# Patient Record
Sex: Female | Born: 1975 | Race: White | Hispanic: No | Marital: Married | Smoking: Never smoker
Health system: Southern US, Community
[De-identification: ages and names within clinical notes are randomized; demographics above are authoritative.]

---

## 2021-11-21 ENCOUNTER — Emergency Department (HOSPITAL_COMMUNITY): Payer: 59

## 2021-11-21 ENCOUNTER — Other Ambulatory Visit (HOSPITAL_COMMUNITY): Payer: 59

## 2021-11-21 ENCOUNTER — Other Ambulatory Visit: Payer: Self-pay

## 2021-11-21 ENCOUNTER — Encounter (HOSPITAL_COMMUNITY): Payer: Self-pay | Admitting: Cardiology

## 2021-11-21 ENCOUNTER — Observation Stay (HOSPITAL_COMMUNITY)
Admission: EM | Admit: 2021-11-21 | Discharge: 2021-11-22 | Disposition: A | Discharge status: Home / Self Care | Payer: 59 | Attending: Cardiology | Admitting: Cardiology

## 2021-11-21 DIAGNOSIS — R55 Syncope and collapse: Principal | ICD-10-CM | POA: Insufficient documentation

## 2021-11-21 DIAGNOSIS — R000 Tachycardia, unspecified: Secondary | ICD-10-CM | POA: Diagnosis not present

## 2021-11-21 DIAGNOSIS — R9431 Abnormal electrocardiogram [ECG] [EKG]: Secondary | ICD-10-CM | POA: Insufficient documentation

## 2021-11-21 DIAGNOSIS — R002 Palpitations: Principal | ICD-10-CM | POA: Insufficient documentation

## 2021-11-21 DIAGNOSIS — R7989 Other specified abnormal findings of blood chemistry: Secondary | ICD-10-CM | POA: Diagnosis not present

## 2021-11-21 DIAGNOSIS — Z20822 Contact with and (suspected) exposure to covid-19: Secondary | ICD-10-CM | POA: Diagnosis not present

## 2021-11-21 LAB — COMPREHENSIVE METABOLIC PANEL
ALT: 19 U/L (ref 0–44)
AST: 22 U/L (ref 15–41)
Albumin: 3.9 g/dL (ref 3.5–5.0)
Alkaline Phosphatase: 46 U/L (ref 38–126)
Anion gap: 9 (ref 5–15)
BUN: 9 mg/dL (ref 6–20)
CO2: 23 mmol/L (ref 22–32)
Calcium: 8.8 mg/dL — ABNORMAL LOW (ref 8.9–10.3)
Chloride: 107 mmol/L (ref 98–111)
Creatinine, Ser: 0.78 mg/dL (ref 0.44–1.00)
GFR, Estimated: >60 mL/min (ref >60)
Glucose, Bld: 97 mg/dL (ref 70–99)
Potassium: 3.9 mmol/L (ref 3.5–5.1)
Sodium: 139 mmol/L (ref 135–145)
Total Bilirubin: 0.4 mg/dL (ref 0.3–1.2)
Total Protein: 6.8 g/dL (ref 6.5–8.1)

## 2021-11-21 LAB — TSH: TSH: 1.209 u[IU]/mL (ref 0.350–4.500)

## 2021-11-21 LAB — CBC WITH DIFFERENTIAL/PLATELET
Abs Immature Granulocytes: 0.02 10*3/uL (ref 0.00–0.07)
Basophils Absolute: 0.1 10*3/uL (ref 0.0–0.1)
Basophils Relative: 1 %
Eosinophils Absolute: 0 10*3/uL (ref 0.0–0.5)
Eosinophils Relative: 1 %
HCT: 46.8 % — ABNORMAL HIGH (ref 36.0–46.0)
Hemoglobin: 16.1 g/dL — ABNORMAL HIGH (ref 12.0–15.0)
Immature Granulocytes: 0 %
Lymphocytes Relative: 15 %
Lymphs Abs: 0.9 10*3/uL (ref 0.7–4.0)
MCH: 33.1 pg (ref 26.0–34.0)
MCHC: 34.4 g/dL (ref 30.0–36.0)
MCV: 96.1 fL (ref 80.0–100.0)
Monocytes Absolute: 0.5 10*3/uL (ref 0.1–1.0)
Monocytes Relative: 7 %
Neutro Abs: 5 10*3/uL (ref 1.7–7.7)
Neutrophils Relative %: 76 %
Platelets: 240 10*3/uL (ref 150–400)
RBC: 4.87 MIL/uL (ref 3.87–5.11)
RDW: 12.3 % (ref 11.5–15.5)
WBC: 6.5 10*3/uL (ref 4.0–10.5)
nRBC: 0 % (ref 0.0–0.2)

## 2021-11-21 LAB — MAGNESIUM: Magnesium: 2 mg/dL (ref 1.7–2.4)

## 2021-11-21 LAB — TROPONIN I (HIGH SENSITIVITY)
Troponin I (High Sensitivity): 5 ng/L (ref <18)
Troponin I (High Sensitivity): 6 ng/L (ref <18)

## 2021-11-21 LAB — I-STAT BETA HCG BLOOD, ED (MC, WL, AP ONLY): I-stat hCG, quantitative: <5 m[IU]/mL (ref <5)

## 2021-11-21 LAB — BRAIN NATRIURETIC PEPTIDE: B Natriuretic Peptide: 318 pg/mL — ABNORMAL HIGH (ref 0.0–100.0)

## 2021-11-21 LAB — RESP PANEL BY RT-PCR (FLU A&B, COVID) ARPGX2
Influenza A by PCR: NEGATIVE
Influenza B by PCR: NEGATIVE
SARS Coronavirus 2 by RT PCR: NEGATIVE

## 2021-11-21 LAB — D-DIMER, QUANTITATIVE: D-Dimer, Quant: 0.31 ug/mL-FEU (ref 0.00–0.50)

## 2021-11-21 MED ORDER — LACTATED RINGERS IV BOLUS
1000.0000 mL | Freq: Once | INTRAVENOUS | Status: AC
Start: 2021-11-21 — End: 2021-11-21
  Administered 2021-11-21: 1000 mL via INTRAVENOUS

## 2021-11-21 NOTE — ED Provider Notes (Addendum)
[facility] EMERGENCY DEPARTMENT Provider Note   CSN: [id number] Arrival date & time: 11/21/21  1104     History  Chief Complaint  Patient presents with   Palpitations   Shortness of Breath    Amanda Oneal is a 46 y.o. female.   Palpitations Associated symptoms: chest pain and shortness of breath   Associated symptoms: no cough   Shortness of Breath Associated symptoms: chest pain   Associated symptoms: no cough    46 year old female presenting to the emergency department with chief complaint of palpitations.  The patient states that she has had intermittent palpitations "for years".  She was seen by her PCP for this earlier this week and there was some concern on EKG for possible WPW.  She was referred to a cardiologist with a plan for Holter monitor placement and had follow-up scheduled for next week.  This morning, she was leaning over at her dresser and immediately felt lightheaded, short of breath with a complaint of chest pressure and squeezing and felt heart palpitations.  Symptoms resolved and she went to church but they then returned at church.  She currently denies any chest pain or shortness of breath and her symptoms of palpitations have since resolved.  She denies any fevers or chills, cough.  She denies any recent travel.  Home Medications Prior to Admission medications   Not on File      Allergies    Patient has no allergy information on record.    Review of Systems   Review of Systems  Respiratory:  Positive for shortness of breath. Negative for cough.   Cardiovascular:  Positive for chest pain and palpitations. Negative for leg swelling.  All other systems reviewed and are negative.  Physical Exam Updated Vital Signs BP 110/62 (BP Location: Right Arm)    Pulse 65    Temp 98 F (36.7 C) (Oral)    Resp 15    Ht 5\' 5"  (1.651 m)    Wt 77.1 kg    SpO2 99%    BMI 28.29 kg/m  Physical Exam Vitals and nursing note reviewed.  Constitutional:       General: She is not in acute distress.    Appearance: She is well-developed.  HENT:     Head: Normocephalic and atraumatic.  Eyes:     Conjunctiva/sclera: Conjunctivae normal.     Pupils: Pupils are equal, round, and reactive to light.  Cardiovascular:     Rate and Rhythm: Normal rate and regular rhythm.     Heart sounds: No murmur heard. Pulmonary:     Effort: Pulmonary effort is normal. No respiratory distress.     Breath sounds: Normal breath sounds.  Abdominal:     General: There is no distension.     Palpations: Abdomen is soft.     Tenderness: There is no abdominal tenderness. There is no guarding.  Musculoskeletal:        General: No swelling, deformity or signs of injury. Normal range of motion.     Cervical back: Neck supple.     Right lower leg: No tenderness. No edema.     Left lower leg: No tenderness. No edema.  Skin:    General: Skin is warm and dry.     Capillary Refill: Capillary refill takes less than 2 seconds.     Findings: No lesion or rash.  Neurological:     General: No focal deficit present.     Mental Status: She is alert. Mental  status is at baseline.  Psychiatric:        Mood and Affect: Mood normal.    ED Results / Procedures / Treatments   Labs (all labs ordered are listed, but only abnormal results are displayed) Labs Reviewed  CBC WITH DIFFERENTIAL/PLATELET - Abnormal; Notable for the following components:      Result Value   Hemoglobin 16.1 (*)    HCT 46.8 (*)    All other components within normal limits  COMPREHENSIVE METABOLIC PANEL - Abnormal; Notable for the following components:   Calcium 8.8 (*)    All other components within normal limits  BRAIN NATRIURETIC PEPTIDE - Abnormal; Notable for the following components:   B Natriuretic Peptide 318.0 (*)    All other components within normal limits  RESP PANEL BY RT-PCR (FLU A&B, COVID) ARPGX2  TSH  D-DIMER, QUANTITATIVE  MAGNESIUM  I-STAT BETA HCG BLOOD, ED ([facility], [facility], [facility] ONLY)   TROPONIN I (HIGH SENSITIVITY)    EKG None  Radiology DG Chest 2 View  Result Date: 11/21/2021 CLINICAL DATA:  Near syncope.  Shortness of breath. EXAM: CHEST - 2 VIEW COMPARISON:  None. FINDINGS: The heart size and mediastinal contours are within normal limits. Both lungs are clear. The visualized skeletal structures are unremarkable. IMPRESSION: No active cardiopulmonary disease. Electronically Signed   By: Kerby Moors M.D.   On: 11/21/2021 12:19    Procedures Procedures    Medications Ordered in ED Medications  lactated ringers bolus 1,000 mL (has no administration in time range)    ED Course/ Medical Decision Making/ A&P                           Medical Decision Making Amount and/or Complexity of Data Reviewed Labs: ordered. Radiology: ordered.  Risk Decision regarding hospitalization.   46 year old female presenting to the emergency department with chief complaint of palpitations.  The patient states that she has had intermittent palpitations "for years".  She was seen by her PCP for this earlier this week and there was some concern on EKG for possible WPW.  She was referred to a cardiologist with a plan for Holter monitor placement and had follow-up scheduled for next week.  This morning, she was leaning over at her dresser and immediately felt lightheaded, short of breath with a complaint of chest pressure and squeezing and felt heart palpitations.  Symptoms resolved and she went to church but they then returned at church.  She currently denies any chest pain or shortness of breath and her symptoms of palpitations have since resolved.  She denies any fevers or chills, cough.  She denies any recent travel.  Additional history provided by the patient's significant other who was present bedside.  Reviewed the patient's PCP notes in epic.  On arrival, the patient was afebrile, hemodynamically stable, saturating well on room air, normal sinus rhythm noted on cardiac telemetry.   Patient presenting with palpitations and near syncopal episode that occurred x2 today.  Also associated episode of chest pressure.  No signs or symptoms of DVT on exam.  Frontal diagnosis includes cardiac arrhythmia to include WPW, other accessory pathway, SVT, orthostatic hypotension, vasovagal near syncope, thyroid dysfunction, PE.   An EKG was performed which revealed sinus rhythm, ventricular rate 71, PR 119, QRS borderline widened at 119 with a slurred delta wave upstroke concerning for possible WPW.    Screening laboratory work-up significant for hCG normal, D-dimer negative at 0.31, CMP without electrolyte  abnormality, CBC with signs of hemoconcentration with a hemoglobin of 16.1, new look no leukocytosis, magnesium normal, TSH normal, initial troponin negative, repeat pending, BNP nonspecifically moderately elevated to 318.  I spoke with on-call cardiology Dr. Shanon Brow who agrees with that there is concern for accessory pathway and septic the patient in admission.  Talk with the patient and her significant other at bedside regarding the plan for admission and updated them regarding the plan of care.  Final Clinical Impression(s) / ED Diagnoses Final diagnoses:  Heart palpitations  Elevated brain natriuretic peptide (BNP) level    Rx / DC Orders ED Discharge Orders     None         Regan Lemming, MD 11/21/21 1346    Regan Lemming, MD 11/21/21 1348

## 2021-11-21 NOTE — Consult Note (Deleted)
CARDIOLOGY CONSULT NOTE  Patient ID: Story Conti MRN: [id number] DOB/AGE: 02/21/1976 46 y.o.  Admit date: 11/21/2021 Referring Physician: [facility] Primary Physician:  Kathlen Brunswick, NP Reason for Consultation:  Near syncope  HPI:   46 y.o. Caucasian female  with no significant personal medical history, family history of early CAD in father, presented with palpitations and near syncope.  Patient is here with her husband.  She works from home.  Today, she was getting ready for church, when she had sudden onset palpitation associated with lightheadedness and near syncope.  Patient sat down.  Symptoms wore off in a few minutes.  She was able to get ready and go to the church.  She had recurrence of similar symptoms while at the church.  She managed to get outside the church, and felt a sudden rush of similar symptoms.  Vagal maneuvers did not help on both occasions. She did not lose consciousness.  She called 911 and was brought to emergency room.  By the time patient arrived at ER, symptoms had resolved.  She has not had any symptoms of tachycardia while in the ER.  Resting EKG showed possible preexcitation accessory pathway. BNP elevated at 380. She denies any dyspnea, orthopnea, PND, leg edema symptoms.   Patient has had similar symptoms to lesser extent over the past couple years. She has had one syncope over 6 months ago that was felt to be associated with IBS and bowel motions, possible vasovagal syncope.   History reviewed. No pertinent past medical history.   History reviewed. No pertinent surgical history.    Family History  Problem Relation Age of Onset   CAD Father      Social History: Social History   Socioeconomic History   Marital status: Married    Spouse name: Not on file   Number of children: Not on file   Years of education: Not on file   Highest education level: Not on file  Occupational History   Not on file  Tobacco Use   Smoking status: Not on file    Smokeless tobacco: Not on file  Substance and Sexual Activity   Alcohol use: Not on file   Drug use: Not on file   Sexual activity: Not on file  Other Topics Concern   Not on file  Social History Narrative   Not on file   Social Determinants of Health   Financial Resource Strain: Not on file  Food Insecurity: Not on file  Transportation Needs: Not on file  Physical Activity: Not on file  Stress: Not on file  Social Connections: Not on file  Intimate Partner Violence: Not on file     (Not in a hospital admission)   Review of Systems  Cardiovascular:  Positive for palpitations. Negative for chest pain, dyspnea on exertion, leg swelling and syncope.  Neurological:  Positive for light-headedness.     Physical Exam: Physical Exam Vitals and nursing note reviewed.  Constitutional:      General: She is not in acute distress. Neck:     Vascular: No JVD.  Cardiovascular:     Rate and Rhythm: Normal rate and regular rhythm.     Heart sounds: Normal heart sounds. No murmur heard. Pulmonary:     Effort: Pulmonary effort is normal.     Breath sounds: Normal breath sounds. No wheezing or rales.  Musculoskeletal:     Right lower leg: No edema.     Left lower leg: No edema.  Lab Results: Reviewed and interpreted: CBC, BMP  Imaging/tests reviewed and independently interpreted: Chest Xray  Tests ordered: Echocardiogram  Cardiac Studies:  Telemetry 11/21/2021: No tachycardia  EKG 11/21/2020: Sinus rhythm 71 bpm Short PR interval with delta wave suggestive of pre-excitation accessory pathway  Echocardiogram : Ordered  Assessment & Recommendations:   46 y.o. Caucasian female  with no significant personal medical history, family history of early CAD in father, presented with palpitations and near syncope.  Near syncope: Symptoms concerning for probable tachyarrhythmia. 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  Recommend overnight  telemetry. Should she have AVRT, IV procainamide will be recommended. Will then seek EP input for ablation. If not, will discharge on 2 week cardiac telemetry. Echocardiogram ordered.  Elevated BNP: Likely related to tachyarrhythmia. Unlikely to represent heart failure.      Elder Negus, MD Pager: [phone] Office: [phone]

## 2021-11-21 NOTE — TOC Progression Note (Signed)
Transition of Care [facility]) - Progression Note    Patient Details  Name: Amanda Oneal MRN: [id number] Date of Birth: 07-04-1976  Transition of Care [facility]) CM/SW Contact  Leone Haven, RN Phone Number: 11/21/2021, 3:52 PM  Clinical Narrative:     Transition of Care [facility]) Screening Note   Patient Details  Name: Amanda Oneal Date of Birth: [birth year]   Transition of Care [facility]) CM/SW Contact:    Leone Haven, RN Phone Number: 11/21/2021, 3:52 PM    Transition of Care Department [facility]) has reviewed patient and no TOC needs have been identified at this time. We will continue to monitor patient advancement through interdisciplinary progression rounds. If new patient transition needs arise, please place a TOC consult.          Expected Discharge Plan and Services                                                 Social Determinants of Health (SDOH) Interventions    Readmission Risk Interventions No flowsheet data found.

## 2021-11-21 NOTE — ED Triage Notes (Signed)
Pt arrived via [facility] from church. Per EMS, pt c/o palpitations, SOB, and feeling faint since she woke up this morning and that it worsened after getting to church. Pt was evaluated last week by PCP with same complaints and was possibly in WPW at the time and was referred to a cardiologist. Pt states significant increase in stress the past month. EMS further reports pt c/o tingling in extremities with tachypnea on their initial contact which improved throughout transport. Pt arrived to ED caox4, in no obvious distress, denying pain or palpitations.

## 2021-11-22 ENCOUNTER — Inpatient Hospital Stay (HOSPITAL_COMMUNITY): Payer: 59

## 2021-11-22 ENCOUNTER — Other Ambulatory Visit: Payer: Self-pay | Admitting: Cardiology

## 2021-11-22 DIAGNOSIS — Z8249 Family history of ischemic heart disease and other diseases of the circulatory system: Secondary | ICD-10-CM

## 2021-11-22 DIAGNOSIS — R002 Palpitations: Secondary | ICD-10-CM

## 2021-11-22 DIAGNOSIS — R072 Precordial pain: Secondary | ICD-10-CM

## 2021-11-22 DIAGNOSIS — R55 Syncope and collapse: Secondary | ICD-10-CM

## 2021-11-22 LAB — ECHOCARDIOGRAM COMPLETE
AR max vel: 2.81 cm2
AV Peak grad: 6.2 mmHg
Ao pk vel: 1.24 m/s
Area-P 1/2: 3.12 cm2
Calc EF: 58.5 %
Height: 65 in
S' Lateral: 2.9 cm
Single Plane A2C EF: 59 %
Single Plane A4C EF: 57.5 %
Weight: 2774.4 oz

## 2021-11-22 MED ORDER — ENOXAPARIN SODIUM 40 MG/0.4ML IJ SOSY
40.0000 mg | PREFILLED_SYRINGE | INTRAMUSCULAR | Status: DC
  Administered 2021-11-22: 40 mg via SUBCUTANEOUS
  Filled 2021-11-22: qty 0.4

## 2021-11-22 NOTE — TOC Transition Note (Signed)
Transition of Care [facility]) - CM/SW Discharge Note   Patient Details  Name: Amanda Oneal MRN: [id number] Date of Birth: Jun 23, 1976  Transition of Care [facility]) CM/SW Contact:  Leone Haven, RN Phone Number: 11/22/2021, 12:17 PM   Clinical Narrative:    Patient is for dc today, she states her Mother will transport her home today.  She is indep and has no needs.    Final next level of care: Home/Self Care Barriers to Discharge: No Barriers Identified   Patient Goals and CMS Choice Patient states their goals for this hospitalization and ongoing recovery are:: return home   Choice offered to / list presented to : NA  Discharge Placement                       Discharge Plan and Services                  DME Agency: NA                  Social Determinants of Health (SDOH) Interventions     Readmission Risk Interventions No flowsheet data found.

## 2021-11-22 NOTE — Discharge Summary (Signed)
Physician Discharge Summary  Patient ID: Amanda Oneal MRN: [id number] DOB/AGE: 06-26-1976 46 y.o.  Admit date: 11/21/2021 Discharge date: 11/22/2021  Primary Discharge Diagnosis: Near syncope Sinus tachycardia Pre-excitation    HPI (11/21/2021):   46 y.o. Caucasian female  with no significant personal medical history, family history of early CAD in father, presented with palpitations and near syncope.   Patient is here with her husband.  She works from home.  Today, she was getting ready for church, when she had sudden onset palpitation associated with lightheadedness and near syncope.  Patient sat down.  Symptoms wore off in a few minutes.  She was able to get ready and go to the church.  She had recurrence of similar symptoms while at the church.  She managed to get outside the church, and felt a sudden rush of similar symptoms.  Vagal maneuvers did not help on both occasions. She did not lose consciousness.  She called 911 and was brought to emergency room.  By the time patient arrived at ER, symptoms had resolved.  She has not had any symptoms of tachycardia while in the ER.  Resting EKG showed possible preexcitation accessory pathway. BNP elevated at 380. She denies any dyspnea, orthopnea, PND, leg edema symptoms.    Patient has had similar symptoms to lesser extent over the past couple years. She has had one syncope over 6 months ago that was felt to be associated with IBS and bowel motions, possible vasovagal syncope.    Hospital Course: Hospital course was uneventful. She had a few episodes of tachycardia, all sinus tachycardia. No AVRT seen. Echocardiogram showed structurally normal heart. Patient was discharged with plans for outpatient 2 week live cardiac telemetry, exercise treadmill stress test, CT cardiac scoring.   Discharge Exam: Blood pressure 117/65, pulse 77, temperature 98.6 F (37 C), temperature source Oral, resp. rate 18, height 5\' 5"  (1.651 m), weight 78.7 kg, SpO2 97 %.    Physical Exam Vitals and nursing note reviewed.  Constitutional:      General: She is not in acute distress. Neck:     Vascular: No JVD.  Cardiovascular:     Rate and Rhythm: Normal rate and regular rhythm.     Heart sounds: Normal heart sounds. No murmur heard. Pulmonary:     Effort: Pulmonary effort is normal.     Breath sounds: Normal breath sounds. No wheezing or rales.  Musculoskeletal:     Right lower leg: No edema.     Left lower leg: No edema.      Significant Diagnostic Studies:  EKG 11/21/2020: Sinus rhythm 71 bpm Short PR interval with delta wave suggestive of pre-excitation accessory pathway   Echocardiogram 11/22/2021:  1. Left ventricular ejection fraction, by estimation, is 60 to 65%. The  left ventricle has normal function. The left ventricle has no regional  wall motion abnormalities. Left ventricular diastolic parameters were  normal.   2. Right ventricular systolic function is normal. The right ventricular  size is normal.   3. The mitral valve is normal in structure. No evidence of mitral valve  regurgitation. No evidence of mitral stenosis.   4. The aortic valve is normal in structure. Aortic valve regurgitation is  not visualized. No aortic stenosis is present.   Labs:   Lab Results  Component Value Date   WBC 6.5 11/21/2021   HGB 16.1 (H) 11/21/2021   HCT 46.8 (H) 11/21/2021   MCV 96.1 11/21/2021   PLT 240 11/21/2021    Recent Labs  Lab 11/21/21 1143  NA 139  K 3.9  CL 107  CO2 23  BUN 9  CREATININE 0.78  CALCIUM 8.8*  PROT 6.8  BILITOT 0.4  ALKPHOS 46  ALT 19  AST 22  GLUCOSE 97    Lipid Panel  No results found for: CHOL, TRIG, HDL, CHOLHDL, VLDL, LDLCALC  BNP (last 3 results) Recent Labs    11/21/21 1143  BNP 318.0*   TSH Recent Labs    11/21/21 1143  TSH 1.209    Radiology: DG Chest 2 View  Result Date: 11/21/2021 CLINICAL DATA:  Near syncope.  Shortness of breath. EXAM: CHEST - 2 VIEW COMPARISON:  None.  FINDINGS: The heart size and mediastinal contours are within normal limits. Both lungs are clear. The visualized skeletal structures are unremarkable. IMPRESSION: No active cardiopulmonary disease. Electronically Signed   By: Signa Kellaylor  Stroud M.D.   On: 11/21/2021 12:19   ECHOCARDIOGRAM COMPLETE  Result Date: 11/22/2021    ECHOCARDIOGRAM REPORT   [name] Date of Exam: 11/22/2021 Medical Rec #:  [id number]     Height:       65.0 in Accession #:    [id number]    Weight:       173.4 lb Date of Birth:  October 07, 1976     BSA:          1.862 m Patient Age:    46 years      BP:           112/75 mmHg Patient Gender: F             HR:           70 bpm. Exam Location:  Inpatient Procedure: 2D Echo, Cardiac Doppler and Color Doppler Indications:    Abnormal EKG  History:        Patient has no prior history of Echocardiogram examinations.  Sonographer:    Cleatis Polkaaylor Peper Referring Phys: [id number] [facility] Rina Adney IMPRESSIONS  1. Left ventricular ejection fraction, by estimation, is 60 to 65%. The left ventricle has normal function. The left ventricle has no regional wall motion abnormalities. Left ventricular diastolic parameters were normal.  2. Right ventricular systolic function is normal. The right ventricular size is normal.  3. The mitral valve is normal in structure. No evidence of mitral valve regurgitation. No evidence of mitral stenosis.  4. The aortic valve is normal in structure. Aortic valve regurgitation is not visualized. No aortic stenosis is present. FINDINGS  Left Ventricle: Left ventricular ejection fraction, by estimation, is 60 to 65%. The left ventricle has normal function. The left ventricle has no regional wall motion abnormalities. The left ventricular internal cavity size was normal in size. There is  no left ventricular hypertrophy. Left ventricular diastolic parameters were normal. Right Ventricle: The right ventricular size is normal. No increase in right ventricular wall thickness.  Right ventricular systolic function is normal. Left Atrium: Left atrial size was normal in size. Right Atrium: Right atrial size was normal in size. Pericardium: There is no evidence of pericardial effusion. Mitral Valve: The mitral valve is normal in structure. No evidence of mitral valve regurgitation. No evidence of mitral valve stenosis. Tricuspid Valve: The tricuspid valve is normal in structure. Tricuspid valve regurgitation is not demonstrated. No evidence of tricuspid stenosis. Aortic Valve: The aortic valve is normal in structure. Aortic valve regurgitation is not visualized. No aortic stenosis is present. Aortic valve peak gradient measures 6.2 mmHg. Pulmonic Valve: The pulmonic valve  was normal in structure. Pulmonic valve regurgitation is not visualized. No evidence of pulmonic stenosis. Aorta: The aortic root is normal in size and structure. IAS/Shunts: The interatrial septum was not assessed.  LEFT VENTRICLE PLAX 2D LVIDd:         3.80 cm     Diastology LVIDs:         2.90 cm     LV e' medial:    11.30 cm/s LV PW:         0.70 cm     LV E/e' medial:  4.4 LV IVS:        0.90 cm     LV e' lateral:   14.50 cm/s LVOT diam:     2.00 cm     LV E/e' lateral: 3.5 LV SV:         59 LV SV Index:   32 LVOT Area:     3.14 cm  LV Volumes (MOD) LV vol d, MOD A2C: 73.6 ml LV vol d, MOD A4C: 81.8 ml LV vol s, MOD A2C: 30.2 ml LV vol s, MOD A4C: 34.8 ml LV SV MOD A2C:     43.4 ml LV SV MOD A4C:     81.8 ml LV SV MOD BP:      46.0 ml RIGHT VENTRICLE             IVC RV Basal diam:  3.00 cm     IVC diam: 1.40 cm RV Mid diam:    2.20 cm RV S prime:     12.30 cm/s TAPSE (M-mode): 2.1 cm LEFT ATRIUM             Index        RIGHT ATRIUM          Index LA diam:        2.60 cm 1.40 cm/m   RA Area:     9.84 cm LA Vol (A2C):   28.2 ml 15.15 ml/m  RA Volume:   20.50 ml 11.01 ml/m LA Vol (A4C):   17.8 ml 9.56 ml/m LA Biplane Vol: 22.7 ml 12.19 ml/m  AORTIC VALVE AV Area (Vmax): 2.81 cm AV Vmax:        124.00 cm/s AV Peak  Grad:   6.2 mmHg LVOT Vmax:      111.00 cm/s LVOT Vmean:     82.000 cm/s LVOT VTI:       0.188 m  AORTA Ao Root diam: 2.80 cm Ao Asc diam:  2.80 cm MITRAL VALVE MV Area (PHT): 3.12 cm    SHUNTS MV Decel Time: 243 msec    Systemic VTI:  0.19 m MV E velocity: 50.10 cm/s  Systemic Diam: 2.00 cm MV A velocity: 55.00 cm/s MV E/A ratio:  0.91 Aldric Wenzler MD Electronically signed by Truett Mainland MD Signature Date/Time: 11/22/2021/11:58:18 AM    Final       FOLLOW UP PLANS AND APPOINTMENTS Discharge Instructions     Diet - low sodium heart healthy   Complete by: As directed    Increase activity slowly   Complete by: As directed       Allergies as of 11/22/2021   No Known Allergies      Medication List     STOP taking these medications    ALPRAZolam 0.25 MG tablet Commonly known as: Prudy Feeler        Follow-up Information     April Manson, NP Follow up.   Specialty: Family Medicine Why: Please  follow up in a week. Contact information: [address] [place] [address] [phone]                   Elder Negus, MD Pager: [phone] Office: [phone]

## 2021-11-22 NOTE — H&P (Signed)
Amanda Oneal is an 46 y.o. female.    Admit date: 11/21/2021 Referring Physician: [facility] Primary Physician:  Bradly Bienenstock, NP Reason for Consultation:  Near syncope   HPI:    46 y.o. Caucasian female  with no significant personal medical history, family history of early CAD in father, presented with palpitations and near syncope.   Patient is here with her husband.  She works from home.  Today, she was getting ready for church, when she had sudden onset palpitation associated with lightheadedness and near syncope.  Patient sat down.  Symptoms wore off in a few minutes.  She was able to get ready and go to the church.  She had recurrence of similar symptoms while at the church.  She managed to get outside the church, and felt a sudden rush of similar symptoms.  Vagal maneuvers did not help on both occasions. She did not lose consciousness.  She called 911 and was brought to emergency room.  By the time patient arrived at ER, symptoms had resolved.  She has not had any symptoms of tachycardia while in the ER.  Resting EKG showed possible preexcitation accessory pathway. BNP elevated at 380. She denies any dyspnea, orthopnea, PND, leg edema symptoms.    Patient has had similar symptoms to lesser extent over the past couple years. She has had one syncope over 6 months ago that was felt to be associated with IBS and bowel motions, possible vasovagal syncope.    History reviewed. No pertinent past medical history.    History reviewed. No pertinent surgical history.     Family History  Problem Relation Age of Onset   CAD Father     Social History:  reports that she has never smoked. She has never used smokeless tobacco. She reports that she does not use drugs. No history on file for alcohol use.  Allergies: No Known Allergies  Review of Systems  Cardiovascular:  Positive for palpitations. Negative for chest pain, dyspnea on exertion, leg swelling and syncope.  Neurological:  Positive  for light-headedness.    Blood pressure 117/65, pulse 77, temperature 98.6 F (37 C), temperature source Oral, resp. rate 18, height 5\' 5"  (1.651 m), weight 78.7 kg, SpO2 97 %. Body mass index is 28.86 kg/m.  Physical Exam Vitals and nursing note reviewed.  Constitutional:      General: She is not in acute distress. Neck:     Vascular: No JVD.  Cardiovascular:     Rate and Rhythm: Normal rate and regular rhythm.     Heart sounds: Normal heart sounds. No murmur heard. Pulmonary:     Effort: Pulmonary effort is normal.     Breath sounds: Normal breath sounds. No wheezing or rales.  Musculoskeletal:     Right lower leg: No edema.     Left lower leg: No edema.     No medications prior to admission.      Current Facility-Administered Medications:    enoxaparin (LOVENOX) injection 40 mg, 40 mg, Subcutaneous, Q24H, Isaak Delmundo J, MD, 40 mg at 11/22/21 0816 No current outpatient medications on file.   Today's Vitals   11/22/21 0400 11/22/21 0423 11/22/21 0715 11/22/21 1204  BP:  112/75 (!) 114/59 117/65  Pulse:  72 68 77  Resp:  18 18 18   Temp:  98.4 F (36.9 C) 98.3 F (36.8 C) 98.6 F (37 C)  TempSrc:  Oral Oral Oral  SpO2:  99% 98% 97%  Weight:  Height:      PainSc: 0-No pain  0-No pain    Body mass index is 28.86 kg/m.   Lab Results: Reviewed and interpreted: CBC, BMP   Imaging/tests reviewed and independently interpreted: Chest Xray   Tests ordered: Echocardiogram   Cardiac Studies:   Telemetry 11/21/2021: No tachycardia   EKG 11/21/2020: Sinus rhythm 71 bpm Short PR interval with delta wave suggestive of pre-excitation accessory pathway   Echocardiogram : Ordered   Assessment & Recommendations:     46 y.o. Caucasian female  with no significant personal medical history, family history of early CAD in father, presented with palpitations and near syncope.   Near syncope: Symptoms concerning for probable tachyarrhythmia. 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  Recommend overnight telemetry. Should she have AVRT, IV procainamide will be recommended. Will then seek EP input for ablation. If not, will discharge on 2 week cardiac telemetry. Echocardiogram ordered.   Elevated BNP: Likely related to tachyarrhythmia. Unlikely to represent heart failure.     Nigel Mormon, MD Pager: [phone] Office: [phone]

## 2021-11-23 ENCOUNTER — Other Ambulatory Visit: Payer: Self-pay

## 2021-11-23 ENCOUNTER — Inpatient Hospital Stay: Payer: 59

## 2021-11-23 DIAGNOSIS — R002 Palpitations: Secondary | ICD-10-CM

## 2021-11-23 DIAGNOSIS — R55 Syncope and collapse: Secondary | ICD-10-CM

## 2021-12-01 ENCOUNTER — Other Ambulatory Visit: Payer: 59

## 2021-12-14 ENCOUNTER — Ambulatory Visit: Payer: 59

## 2021-12-14 ENCOUNTER — Other Ambulatory Visit: Payer: Self-pay

## 2021-12-14 ENCOUNTER — Other Ambulatory Visit: Payer: 59

## 2021-12-14 DIAGNOSIS — Z8249 Family history of ischemic heart disease and other diseases of the circulatory system: Secondary | ICD-10-CM

## 2021-12-14 DIAGNOSIS — R072 Precordial pain: Secondary | ICD-10-CM

## 2021-12-14 DIAGNOSIS — R55 Syncope and collapse: Secondary | ICD-10-CM

## 2021-12-16 ENCOUNTER — Ambulatory Visit
Admission: RE | Admit: 2021-12-16 | Discharge: 2021-12-16 | Disposition: A | Discharge status: Home / Self Care | Payer: 59 | Source: Ambulatory Visit | Attending: Cardiology | Admitting: Cardiology

## 2021-12-16 DIAGNOSIS — R55 Syncope and collapse: Secondary | ICD-10-CM

## 2021-12-16 DIAGNOSIS — Z8249 Family history of ischemic heart disease and other diseases of the circulatory system: Secondary | ICD-10-CM

## 2021-12-16 DIAGNOSIS — R072 Precordial pain: Secondary | ICD-10-CM

## 2021-12-23 ENCOUNTER — Other Ambulatory Visit: Payer: Self-pay

## 2021-12-23 ENCOUNTER — Ambulatory Visit: Payer: 59 | Admitting: Cardiology

## 2021-12-23 ENCOUNTER — Encounter: Payer: Self-pay | Admitting: Cardiology

## 2021-12-23 VITALS — Temp 98.0°F | Resp 16 | Ht 65.0 in | Wt 176.0 lb

## 2021-12-23 DIAGNOSIS — R9431 Abnormal electrocardiogram [ECG] [EKG]: Secondary | ICD-10-CM

## 2021-12-23 DIAGNOSIS — I4729 Other ventricular tachycardia: Secondary | ICD-10-CM | POA: Insufficient documentation

## 2021-12-23 DIAGNOSIS — R55 Syncope and collapse: Secondary | ICD-10-CM

## 2021-12-23 NOTE — Progress Notes (Signed)
? ?Follow up visit ? ?Subjective:  ? ?Amanda Oneal, female    DOB: 18-Jun-1976, 46 y.o.   MRN: [id number] ? ? ? ?HPI ? ?Chief Complaint  ?Patient presents with  ? Loss of Consciousness  ? New Patient (Initial Visit)  ? Hospitalization Follow-up  ? ? ?47 y.o. Caucasian female  with no significant personal medical history, family history of early CAD in father, palpitations and near syncope. ?  ?Patient has been doing well since discharge. She has not had any episodes of presyncope, syncope, or severe palpitations. She has bought an Apple watch. She has noticed heart rate >100 bpm, but has not noticed any alert showing irregular heart rhythm or Afib. ? ?Discharge summary 11/22/2021: ?Patient is here with her husband.  She works from home.  Today, she was getting ready for church, when she had sudden onset palpitation associated with lightheadedness and near syncope.  Patient sat down.  Symptoms wore off in a few minutes.  She was able to get ready and go to the church.  She had recurrence of similar symptoms while at the church.  She managed to get outside the church, and felt a sudden rush of similar symptoms.  Vagal maneuvers did not help on both occasions. She did not lose consciousness.  She called 911 and was brought to emergency room.  By the time patient arrived at ER, symptoms had resolved.  She has not had any symptoms of tachycardia while in the ER.  Resting EKG showed possible preexcitation accessory pathway. BNP elevated at 380. She denies any dyspnea, orthopnea, PND, leg edema symptoms.  ?  ?Patient has had similar symptoms to lesser extent over the past couple years. She has had one syncope over 6 months ago that was felt to be associated with IBS and bowel motions, possible vasovagal syncope.  ?  ?Hospital Course: ?Hospital course was uneventful. She had a few episodes of tachycardia, all sinus tachycardia. No AVRT seen. Echocardiogram showed structurally normal heart. Patient was discharged with plans for  outpatient 2 week live cardiac telemetry, exercise treadmill stress test, CT cardiac scoring.  ? ? ?Cardiovascular & other pertient studies: ? ?Reviewed external labs and tests, independently interpreted ? ?EKG 12/23/2021: ?Sinus rhythm 64 bpm ?Right axis deviation ?Short PR interval ?Incomplete right bundle branch block ? ?CT cardiac scoring 12/16/2021: ?Calcium score 0 ? ?Exercise treadmill stress test 12/14/2021: ?Exercise treadmill stress test performed using Bruce protocol.  Patient reached 10.1 METS, and 101% of age predicted maximum heart rate.  Exercise capacity was excellent.  No chest pain reported.  Normal heart rate and hemodynamic response. ?Peak ECG demonstrated sinus tachycardia, short PR interval, no delta wave, 2 mm upsloping ST depression in leads II, III, aVF, normalize within 2 min into recovery. These changes are equivocal for ischemia. No arrhythmia noted.  ?Low risk study. ? ?Mobile cardiac telemetry 14 days 11/23/2021 - 12/07/2021: ?Dominant rhythm: Sinus. ?HR 52-169 bpm. Avg HR 77 bpm, in sinus rhythm. ?0 episodes of SVT ?<1% isolated SVE ?1 episodes of VT, at 174 bpm for 7 beats, did not correlate with reported symptoms. ?<1% isolated VE, couplet/triplets. ?No atrial fibrillation/atrial flutter/SVT/high grade AV block, sinus pause >3sec noted. ?2 patient triggered events, correlated with sinus rhythm. ? ?Echocardiogram 11/22/2021: ?1. Left ventricular ejection fraction, by estimation, is 60 to 65%. The  ?left ventricle has normal function. The left ventricle has no regional  ?wall motion abnormalities. Left ventricular diastolic parameters were  ?normal.  ? 2. Right ventricular systolic function is  normal. The right ventricular  ?size is normal.  ? 3. The mitral valve is normal in structure. No evidence of mitral valve  ?regurgitation. No evidence of mitral stenosis.  ? 4. The aortic valve is normal in structure. Aortic valve regurgitation is  ?not visualized. No aortic stenosis is present.  ?   ? ?Recent labs: ?11/21/2021: ?Glucose 97, BUN/Cr 9/0.78. EGFR >60. Na/K 139/3.9. Rest of the CMP normal ?H/H 16/48. MCV 96. Platelets 240 ?HbA1C NA ?Lipid panel NA ?TSH 1.2 normal ?BNP 318 ?Trop HS 5 ? ? ?Review of Systems  ?Cardiovascular:  Positive for palpitations. Negative for chest pain, dyspnea on exertion, leg swelling and syncope.  ? ?   ? ? ?Vitals:  ? 12/23/21 1119 12/23/21 1120  ?Resp:    ?Temp:    ?SpO2: 99% 98%  ? ?Orthostatic VS for the past 72 hrs (Last 3 readings): ? Orthostatic BP Patient Position BP Location Cuff Size Orthostatic Pulse  ?12/23/21 1120 107/70 Standing Left Arm Large 83  ?12/23/21 1119 119/69 Sitting Left Arm Large 64  ?12/23/21 1112 121/71 Supine Left Arm Large 69  ? ? ? ?Body mass index is 29.29 kg/m?. ?Filed Weights  ? 12/23/21 1112  ?Weight: 176 lb (79.8 kg)  ? ? ? ?Objective:  ? Physical Exam ?Vitals and nursing note reviewed.  ?Constitutional:   ?   General: She is not in acute distress. ?Neck:  ?   Vascular: No JVD.  ?Cardiovascular:  ?   Rate and Rhythm: Normal rate and regular rhythm.  ?   Heart sounds: Normal heart sounds. No murmur heard. ?Pulmonary:  ?   Effort: Pulmonary effort is normal.  ?   Breath sounds: Normal breath sounds. No wheezing or rales.  ?Musculoskeletal:  ?   Right lower leg: No edema.  ?   Left lower leg: No edema.  ? ? ? ? ? ?   ? ? ?Visit diagnoses: ?  ICD-10-CM   ?1. Near syncope  R55 EKG 12-Lead  ?  ?2. Shortened PR interval  R94.31   ?  ?3. NSVT (nonsustained ventricular tachycardia)  I47.29   ?  ?  ? ?Orders Placed This Encounter  ?Procedures  ? EKG 12-Lead  ? ?  ? ?Assessment & Recommendations:  ? ?46 y.o. Caucasian female  with no significant personal medical history, family history of early CAD in father, palpitations and near syncope. ? ?Palpitations: ?Resting EKG with short PR interval and slurred upstroke of QRS raising suspicion for accessory pathway. ?Fortunately, QRS seems to be normal during exercise as seen on exercise treadmill stress  test. ?Suspicion is that she may have an accessory pathway, but is not robust enough to conduct at higher heart rates. ?She had one 7 beat episodes of wide-complex tachycardia on to be cardiac telemetry, but no other arrhythmias noted.  Given 1 off episode, and possibility of accessory pathway, I have not placed her on AV nodal blocking agents at this time. ?She has not had any recurrence of presyncope or severe tachycardia episodes like that occurred in 11/2021. ?I offered the patient referral to EP for further evaluation.  Given absence of symptoms, mutually decided to hold off EP evaluation for now.  We will see her back in 6 months.  If there is any recurrence of symptoms, will then refer to EP for consideration for EP study and ablation. ? ?I have enrolled patient's Apple Watch in cardiologs for remote monitoring.   ? ?Family history of CAD: ?Calcium score 0.    Exercise stress test with a focal EKG changes, likely false positive. ? ? ?F/u in 6 months ? ? ? ?Manish J Patwardhan, MD ?Pager: [phone] ?Office: [phone] ?

## 2022-03-08 ENCOUNTER — Encounter: Payer: Self-pay | Admitting: Cardiology

## 2022-03-08 NOTE — Telephone Encounter (Signed)
From patient.

## 2022-06-27 ENCOUNTER — Ambulatory Visit: Payer: 59 | Admitting: Cardiology

## 2022-10-06 ENCOUNTER — Other Ambulatory Visit: Payer: Self-pay | Admitting: Family

## 2022-10-06 DIAGNOSIS — R9389 Abnormal findings on diagnostic imaging of other specified body structures: Secondary | ICD-10-CM

## 2022-10-24 ENCOUNTER — Ambulatory Visit
Admission: RE | Admit: 2022-10-24 | Discharge: 2022-10-24 | Disposition: A | Discharge status: Home / Self Care | Payer: 59 | Source: Ambulatory Visit | Attending: Family | Admitting: Family

## 2022-10-24 DIAGNOSIS — R9389 Abnormal findings on diagnostic imaging of other specified body structures: Secondary | ICD-10-CM

## 2022-10-24 MED ORDER — GADOPICLENOL 0.5 MMOL/ML IV SOLN: 10.0000 mL | Freq: Once | INTRAVENOUS | Status: DC | PRN

## 2023-06-21 IMAGING — CT CT CARDIAC CORONARY ARTERY CALCIUM SCORE
2 of 3 series · 10 of 20 positions shown, 12 images · non-contrast
Comparison: None.

CLINICAL DATA: 46-year-old Caucasian female with family history of
heart disease.

EXAM:
CT CARDIAC CORONARY ARTERY CALCIUM SCORE
TECHNIQUE: Non-contrast imaging through the heart was performed using
prospective ECG gating. Image post processing was performed on an
independent workstation, allowing for quantitative analysis of the
heart and coronary arteries. Note that this exam targets the heart
and the chest was not imaged in its entirety.

[Series 3: calcium scoring 2.00 br40 bestdiast 71% axial · axial · 0.48mm/px · z∈[+1628,+1720]mm · 5 of 70 slices shown, 7 images]
[im 12/70  vessel]
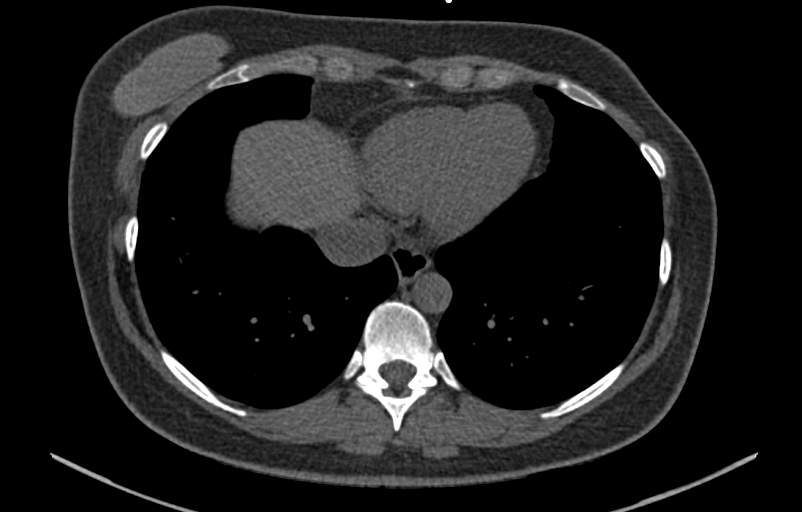
[im 12/70  lung]
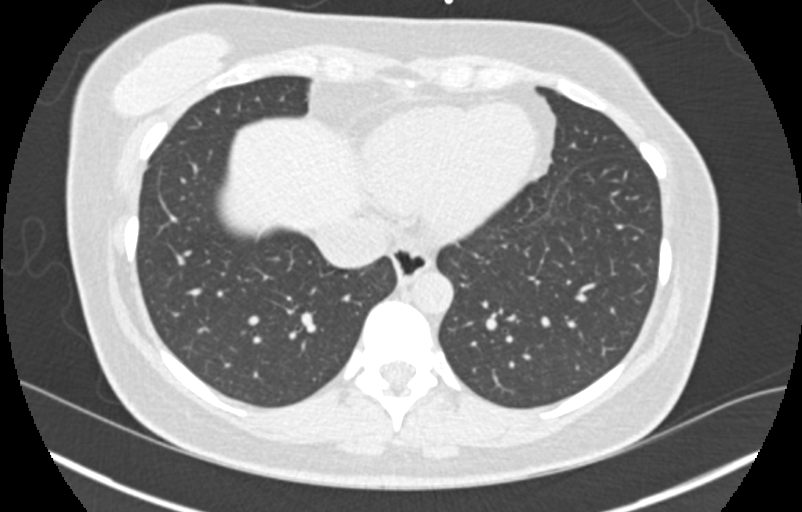
[im 24/70  vessel]
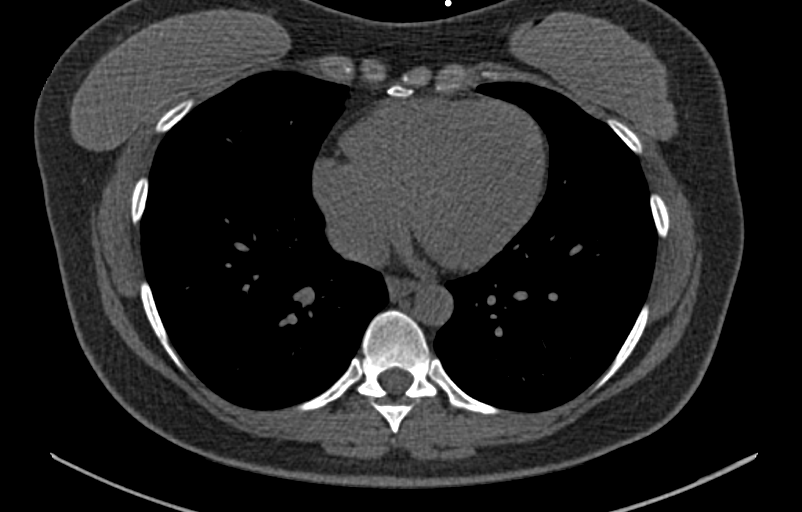
[im 35/70  vessel]
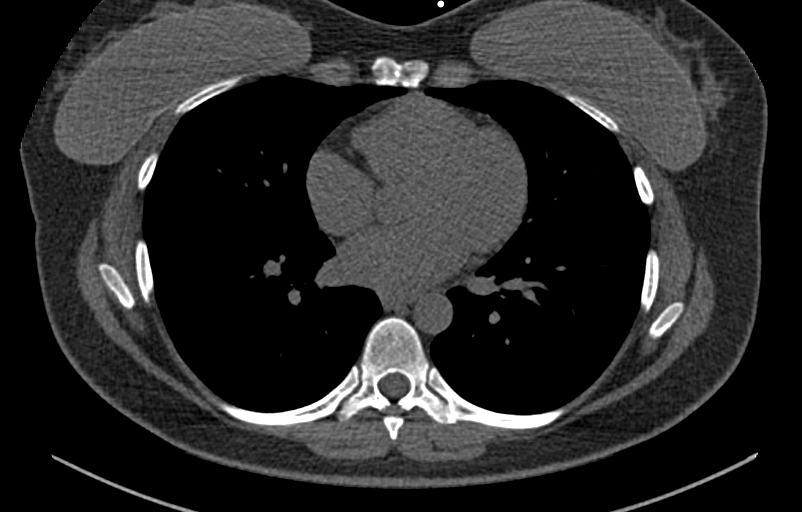
[im 47/70  vessel]
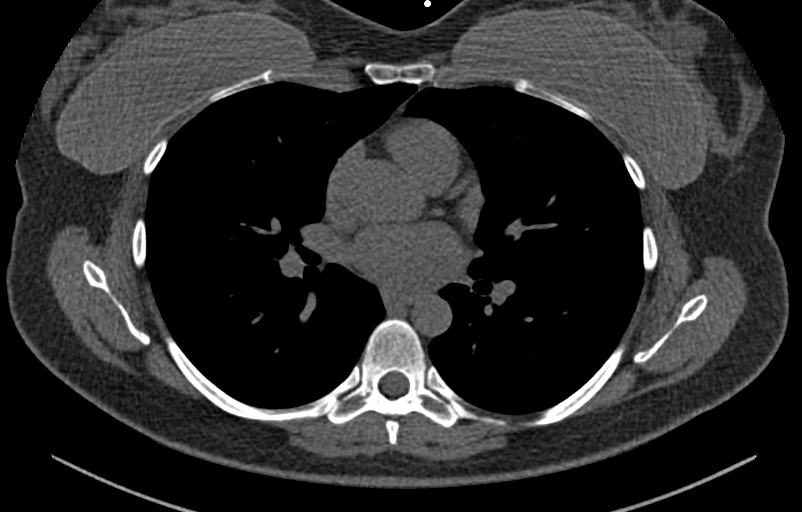
[im 58/70  vessel]
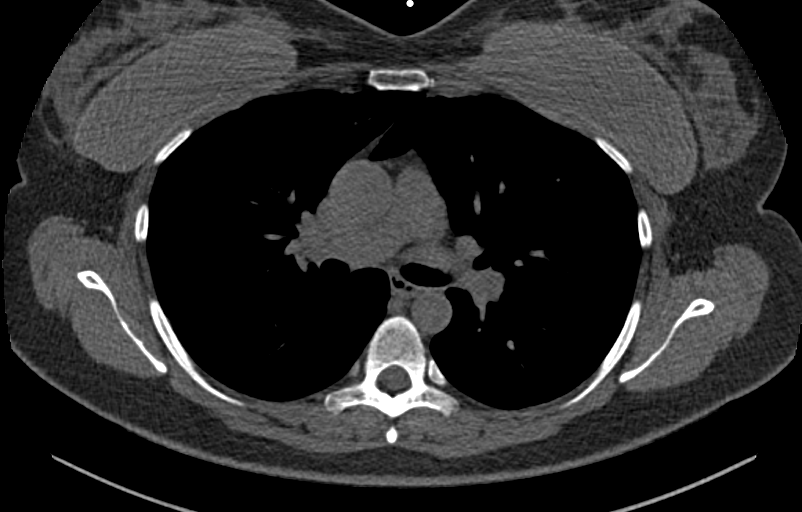
[im 58/70  lung]
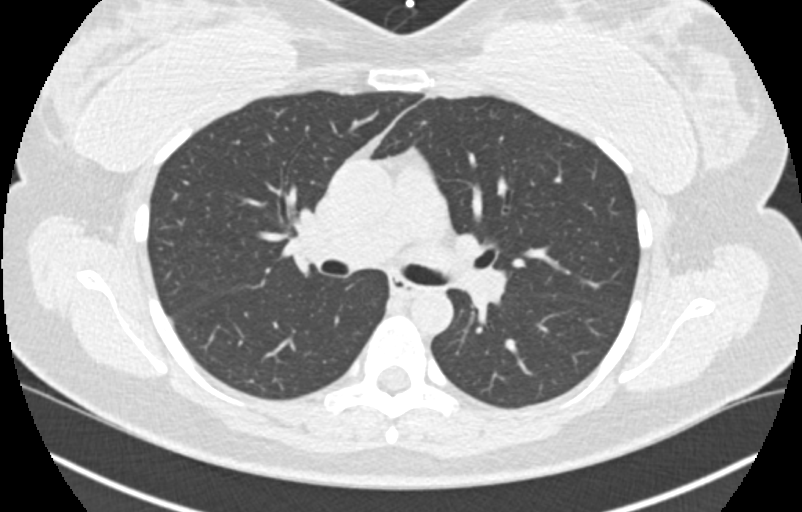

[Series 9: calcium scoring 2.00 br60 bestdiast 71% lungs · axial · 0.48mm/px · z∈[+1628,+1720]mm · 5 of 70 slices shown]
[im 12/70  vessel]
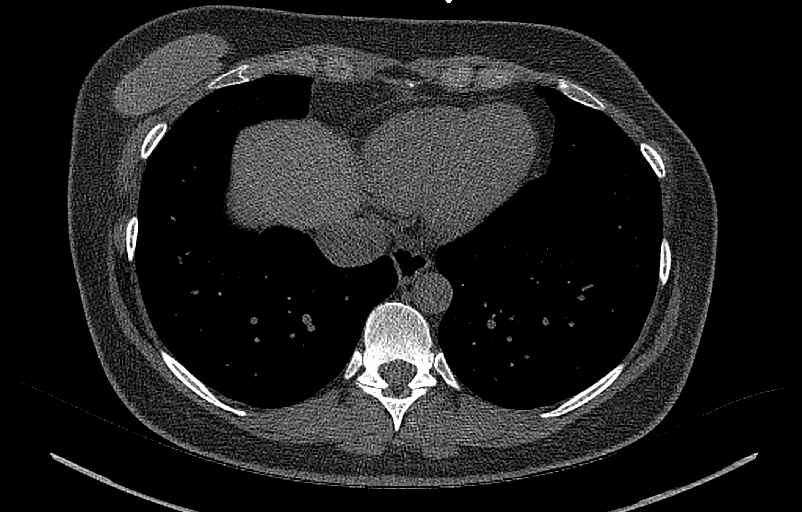
[im 24/70  vessel]
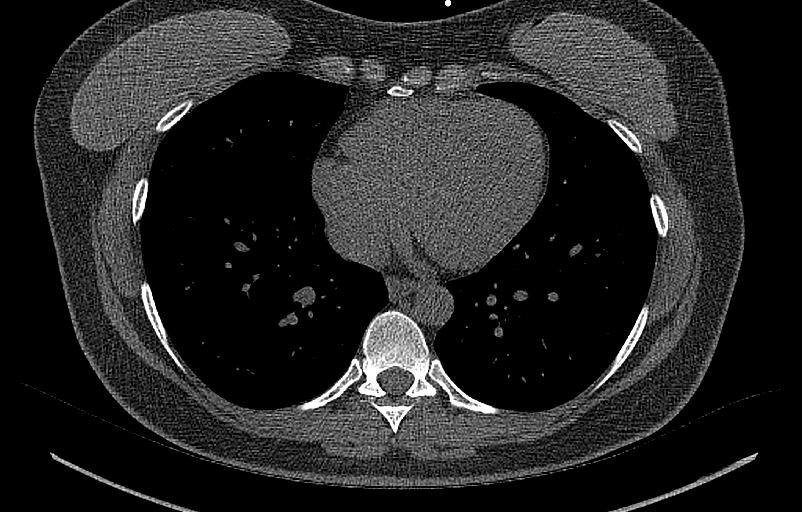
[im 35/70  vessel]
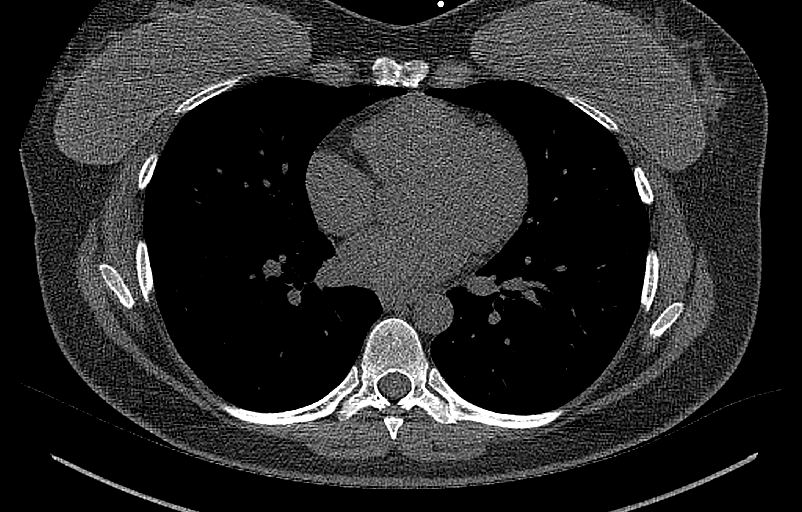
[im 47/70  vessel]
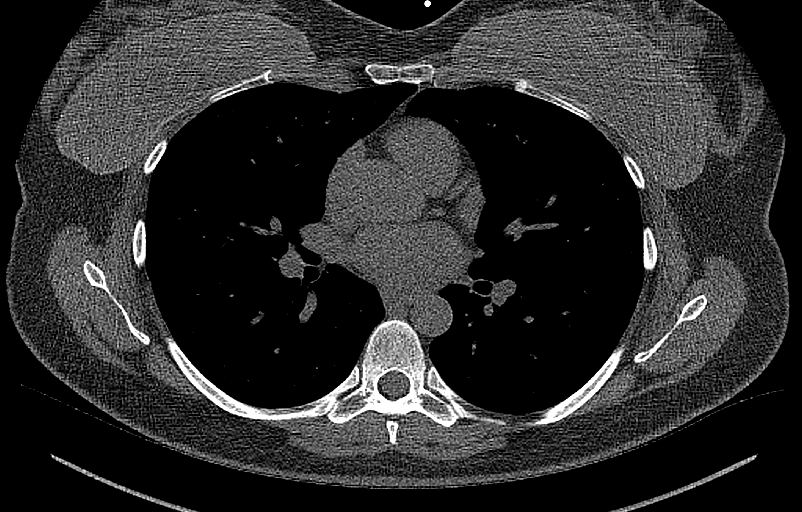
[im 58/70  vessel]
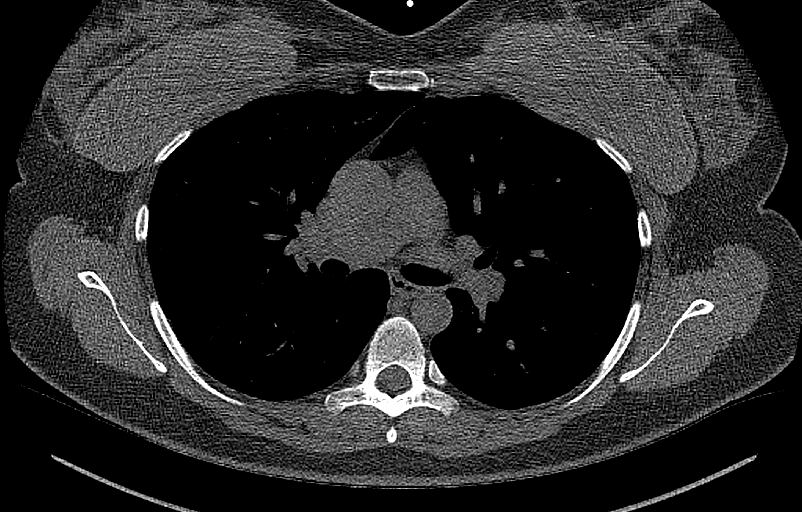

[10 of 20 positions shown; findings below may reference images not displayed]

FINDINGS: CORONARY CALCIUM SCORES:

Left Main: 0

LAD: 0

LCx: 0

RCA: 0

Total Agatston Score: 0

[HOSPITAL] percentile: 0

AORTA MEASUREMENTS:

Ascending Aorta: 31 mm

Descending Aorta: 19 mm

OTHER FINDINGS:

